# Patient Record
Sex: Female | Born: 1975 | Race: White | Hispanic: No | Marital: Married
Health system: Southern US, Community
[De-identification: ages and names within clinical notes are randomized; demographics above are authoritative.]

---

## 2002-10-04 ENCOUNTER — Emergency Department (HOSPITAL_COMMUNITY): Admission: EM | Admit: 2002-10-04 | Discharge: 2002-10-04 | Payer: Self-pay | Admitting: Emergency Medicine

## 2002-10-04 ENCOUNTER — Encounter: Payer: Self-pay | Admitting: Emergency Medicine

## 2004-08-21 ENCOUNTER — Ambulatory Visit: Payer: Self-pay

## 2004-08-22 ENCOUNTER — Ambulatory Visit: Payer: Self-pay

## 2004-08-28 ENCOUNTER — Ambulatory Visit: Payer: Self-pay

## 2005-01-06 ENCOUNTER — Emergency Department: Payer: Self-pay | Admitting: Obstetrics and Gynecology

## 2005-01-07 ENCOUNTER — Ambulatory Visit: Payer: Self-pay | Admitting: Obstetrics and Gynecology

## 2005-04-07 ENCOUNTER — Ambulatory Visit: Payer: Self-pay | Admitting: Cardiothoracic Surgery

## 2005-04-09 ENCOUNTER — Ambulatory Visit: Payer: Self-pay | Admitting: Gastroenterology

## 2005-05-12 ENCOUNTER — Other Ambulatory Visit: Payer: Self-pay

## 2005-05-18 ENCOUNTER — Inpatient Hospital Stay: Payer: Self-pay | Admitting: Surgery

## 2005-07-21 ENCOUNTER — Ambulatory Visit: Payer: Self-pay | Admitting: Gastroenterology

## 2005-09-25 ENCOUNTER — Observation Stay: Payer: Self-pay | Admitting: Gastroenterology

## 2006-12-14 ENCOUNTER — Emergency Department: Payer: Self-pay | Admitting: Unknown Physician Specialty

## 2007-02-11 ENCOUNTER — Ambulatory Visit: Payer: Self-pay | Admitting: Gastroenterology

## 2007-04-07 ENCOUNTER — Ambulatory Visit: Payer: Self-pay | Admitting: Gastroenterology

## 2007-10-04 ENCOUNTER — Ambulatory Visit: Payer: Self-pay | Admitting: Unknown Physician Specialty

## 2007-10-20 ENCOUNTER — Ambulatory Visit: Payer: Self-pay | Admitting: Unknown Physician Specialty

## 2008-05-28 ENCOUNTER — Ambulatory Visit: Payer: Self-pay | Admitting: Specialist

## 2008-05-28 ENCOUNTER — Ambulatory Visit: Payer: Self-pay | Admitting: Unknown Physician Specialty

## 2008-08-28 ENCOUNTER — Emergency Department: Payer: Self-pay | Admitting: Emergency Medicine

## 2009-08-15 ENCOUNTER — Observation Stay: Payer: Self-pay

## 2009-08-22 ENCOUNTER — Encounter: Payer: Self-pay | Admitting: Maternal and Fetal Medicine

## 2009-08-26 ENCOUNTER — Observation Stay: Payer: Self-pay | Admitting: Unknown Physician Specialty

## 2009-08-29 ENCOUNTER — Encounter: Payer: Self-pay | Admitting: Obstetrics & Gynecology

## 2009-12-23 ENCOUNTER — Other Ambulatory Visit: Payer: Self-pay | Admitting: Gastroenterology

## 2010-01-09 ENCOUNTER — Ambulatory Visit: Payer: Self-pay | Admitting: Gastroenterology

## 2010-01-13 ENCOUNTER — Other Ambulatory Visit: Payer: Self-pay | Admitting: Gastroenterology

## 2010-01-14 LAB — PATHOLOGY REPORT

## 2010-07-25 ENCOUNTER — Ambulatory Visit: Payer: Self-pay | Admitting: Gastroenterology

## 2010-07-26 ENCOUNTER — Ambulatory Visit: Payer: Self-pay | Admitting: Gastroenterology

## 2010-07-31 ENCOUNTER — Ambulatory Visit: Payer: Self-pay | Admitting: Gastroenterology

## 2010-08-08 ENCOUNTER — Ambulatory Visit: Payer: Self-pay | Admitting: Gastroenterology

## 2012-05-09 ENCOUNTER — Emergency Department: Payer: Self-pay | Admitting: Emergency Medicine

## 2012-05-09 LAB — COMPREHENSIVE METABOLIC PANEL
Albumin: 4 g/dL (ref 3.4–5.0)
Alkaline Phosphatase: 75 U/L (ref 50–136)
Anion Gap: 10 (ref 7–16)
BUN: 6 mg/dL — ABNORMAL LOW (ref 7–18)
Bilirubin,Total: 0.7 mg/dL (ref 0.2–1.0)
Calcium, Total: 9.4 mg/dL (ref 8.5–10.1)
Chloride: 104 mmol/L (ref 98–107)
Co2: 27 mmol/L (ref 21–32)
Creatinine: 0.72 mg/dL (ref 0.60–1.30)
EGFR (African American): >60
EGFR (Non-African Amer.): >60
Glucose: 116 mg/dL — ABNORMAL HIGH (ref 65–99)
Osmolality: 280 (ref 275–301)
Potassium: 4.3 mmol/L (ref 3.5–5.1)
SGOT(AST): 12 U/L — ABNORMAL LOW (ref 15–37)
SGPT (ALT): 16 U/L (ref 12–78)
Sodium: 141 mmol/L (ref 136–145)
Total Protein: 8.4 g/dL — ABNORMAL HIGH (ref 6.4–8.2)

## 2012-05-09 LAB — CBC
HCT: 46.3 % (ref 35.0–47.0)
HGB: 15.9 g/dL (ref 12.0–16.0)
MCH: 30.1 pg (ref 26.0–34.0)
MCHC: 34.4 g/dL (ref 32.0–36.0)
MCV: 88 fL (ref 80–100)
Platelet: 277 10*3/uL (ref 150–440)
RBC: 5.29 10*6/uL — ABNORMAL HIGH (ref 3.80–5.20)
RDW: 13.4 % (ref 11.5–14.5)
WBC: 15.2 10*3/uL — ABNORMAL HIGH (ref 3.6–11.0)

## 2012-05-09 LAB — URINALYSIS, COMPLETE
Bacteria: NONE SEEN
Bilirubin,UR: NEGATIVE
Glucose,UR: NEGATIVE mg/dL (ref 0–75)
Leukocyte Esterase: NEGATIVE
Nitrite: NEGATIVE
Ph: 5 (ref 4.5–8.0)
Protein: 30
RBC,UR: 15 /HPF (ref 0–5)
Specific Gravity: 1.02 (ref 1.003–1.030)
Squamous Epithelial: 2
WBC UR: 1 /HPF (ref 0–5)

## 2012-05-09 LAB — LIPASE, BLOOD: Lipase: 105 U/L (ref 73–393)

## 2012-05-13 ENCOUNTER — Ambulatory Visit: Payer: Self-pay | Admitting: Gastroenterology

## 2012-11-22 ENCOUNTER — Inpatient Hospital Stay: Payer: Self-pay | Admitting: Internal Medicine

## 2012-11-22 LAB — CBC WITH DIFFERENTIAL/PLATELET
Basophil #: 0.1 10*3/uL (ref 0.0–0.1)
Basophil %: 1.1 %
Eosinophil #: 0 10*3/uL (ref 0.0–0.7)
Eosinophil %: 0.4 %
HCT: 45.1 % (ref 35.0–47.0)
HGB: 15.1 g/dL (ref 12.0–16.0)
Lymphocyte #: 2.5 10*3/uL (ref 1.0–3.6)
Lymphocyte %: 21.8 %
MCH: 28.9 pg (ref 26.0–34.0)
MCHC: 33.5 g/dL (ref 32.0–36.0)
MCV: 86 fL (ref 80–100)
Monocyte #: 0.7 x10 3/mm (ref 0.2–0.9)
Monocyte %: 6 %
Neutrophil #: 8.1 10*3/uL — ABNORMAL HIGH (ref 1.4–6.5)
Neutrophil %: 70.7 %
Platelet: 234 10*3/uL (ref 150–440)
RBC: 5.24 10*6/uL — ABNORMAL HIGH (ref 3.80–5.20)
RDW: 13.5 % (ref 11.5–14.5)
WBC: 11.4 10*3/uL — ABNORMAL HIGH (ref 3.6–11.0)

## 2012-11-22 LAB — URINALYSIS, COMPLETE
Bacteria: NONE SEEN
Bilirubin,UR: NEGATIVE
Glucose,UR: NEGATIVE mg/dL (ref 0–75)
Leukocyte Esterase: NEGATIVE
Nitrite: NEGATIVE
Ph: 5 (ref 4.5–8.0)
RBC,UR: 3 /HPF (ref 0–5)
WBC UR: 2 /HPF (ref 0–5)

## 2012-11-22 LAB — COMPREHENSIVE METABOLIC PANEL
Albumin: 3.7 g/dL (ref 3.4–5.0)
Alkaline Phosphatase: 68 U/L (ref 50–136)
Anion Gap: 8 (ref 7–16)
BUN: 8 mg/dL (ref 7–18)
Bilirubin,Total: 1 mg/dL (ref 0.2–1.0)
Calcium, Total: 8.7 mg/dL (ref 8.5–10.1)
Chloride: 104 mmol/L (ref 98–107)
Co2: 26 mmol/L (ref 21–32)
Creatinine: 0.61 mg/dL (ref 0.60–1.30)
EGFR (African American): >60
EGFR (Non-African Amer.): >60
Glucose: 84 mg/dL (ref 65–99)
Osmolality: 273 (ref 275–301)
Potassium: 3.3 mmol/L — ABNORMAL LOW (ref 3.5–5.1)
SGOT(AST): 15 U/L (ref 15–37)
SGPT (ALT): 17 U/L (ref 12–78)
Sodium: 138 mmol/L (ref 136–145)
Total Protein: 7.4 g/dL (ref 6.4–8.2)

## 2012-11-22 LAB — LIPASE, BLOOD: Lipase: 57 U/L — ABNORMAL LOW (ref 73–393)

## 2012-11-22 LAB — PROTIME-INR
INR: 1.1
Prothrombin Time: 14 secs (ref 11.5–14.7)

## 2012-11-22 LAB — AMYLASE: Amylase: 43 U/L (ref 25–115)

## 2012-11-22 LAB — APTT: Activated PTT: 33.5 secs (ref 23.6–35.9)

## 2012-11-24 LAB — CBC WITH DIFFERENTIAL/PLATELET
Basophil %: 0.2 %
HCT: 38.5 % (ref 35.0–47.0)
Lymphocyte #: 0.6 10*3/uL — ABNORMAL LOW (ref 1.0–3.6)
Lymphocyte %: 9.5 %
MCH: 29.6 pg (ref 26.0–34.0)
MCHC: 34.2 g/dL (ref 32.0–36.0)
Monocyte #: 0.1 x10 3/mm — ABNORMAL LOW (ref 0.2–0.9)
Neutrophil %: 89.1 %
Platelet: 211 10*3/uL (ref 150–440)
RBC: 4.45 10*6/uL (ref 3.80–5.20)

## 2012-11-24 LAB — COMPREHENSIVE METABOLIC PANEL
Albumin: 2.9 g/dL — ABNORMAL LOW (ref 3.4–5.0)
Alkaline Phosphatase: 51 U/L (ref 50–136)
Anion Gap: 5 — ABNORMAL LOW (ref 7–16)
BUN: 4 mg/dL — ABNORMAL LOW (ref 7–18)
Bilirubin,Total: 0.4 mg/dL (ref 0.2–1.0)
Co2: 25 mmol/L (ref 21–32)
EGFR (African American): >60
Glucose: 146 mg/dL — ABNORMAL HIGH (ref 65–99)
Potassium: 4.1 mmol/L (ref 3.5–5.1)
SGOT(AST): 11 U/L — ABNORMAL LOW (ref 15–37)
SGPT (ALT): 13 U/L (ref 12–78)

## 2012-11-26 LAB — CBC WITH DIFFERENTIAL/PLATELET
Basophil #: 0 10*3/uL (ref 0.0–0.1)
Basophil %: 0.2 %
HCT: 37.6 % (ref 35.0–47.0)
HGB: 12.8 g/dL (ref 12.0–16.0)
Lymphocyte #: 2.4 10*3/uL (ref 1.0–3.6)
MCHC: 34 g/dL (ref 32.0–36.0)
Neutrophil #: 11.3 10*3/uL — ABNORMAL HIGH (ref 1.4–6.5)
Platelet: 222 10*3/uL (ref 150–440)
RBC: 4.34 10*6/uL (ref 3.80–5.20)
RDW: 13.7 % (ref 11.5–14.5)
WBC: 14.7 10*3/uL — ABNORMAL HIGH (ref 3.6–11.0)

## 2012-11-26 LAB — BASIC METABOLIC PANEL
Anion Gap: 5 — ABNORMAL LOW (ref 7–16)
Co2: 30 mmol/L (ref 21–32)
EGFR (African American): >60
Potassium: 3.2 mmol/L — ABNORMAL LOW (ref 3.5–5.1)
Sodium: 144 mmol/L (ref 136–145)

## 2012-11-27 LAB — CULTURE, BLOOD (SINGLE)

## 2013-12-12 ENCOUNTER — Ambulatory Visit: Payer: Self-pay | Admitting: Gastroenterology

## 2014-01-23 ENCOUNTER — Ambulatory Visit: Payer: Self-pay | Admitting: Gastroenterology

## 2014-10-26 NOTE — Consult Note (Signed)
Pt seen and examined. Please see Toni Richards's notes. When seen earlier by Toni Richards, patient was feeling well, but now has increasing abd pain with vomiting. Feels like yest again. Too early to advance diet. Will change to clears. If not tolerated, then NPO except meds/ice chips. Ordered another dose of cimzia. Was supposed to get it yest. Will start iv steroids tonight. Will follow. Thanks.  Electronic Signatures: Toni Richards, Toni Richards (MD)  (Signed on 21-May-14 16:27)  Authored  Last Updated: 21-May-14 16:27 by Toni Richards, Toni Richards (MD)

## 2014-10-26 NOTE — Consult Note (Signed)
Chief Complaint:  Subjective/Chief Complaint Feeling much better. No abd pain. No nausea today. Having BM's   VITAL SIGNS/ANCILLARY NOTES: **Vital Signs.:   22-May-14 04:18  Vital Signs Type Routine  Temperature Temperature (F) 97.3  Temperature Source oral  Pulse Pulse 65  Respirations Respirations 18  Systolic BP Systolic BP 295  Diastolic BP (mmHg) Diastolic BP (mmHg) 65  Mean BP 77  Pulse Ox % Pulse Ox % 98  Pulse Ox Activity Level  At rest  Oxygen Delivery Room Air/ 21 %   Brief Assessment:  Cardiac Regular   Respiratory clear BS   Gastrointestinal Normal   Lab Results: Hepatic:  22-May-14 04:37   Bilirubin, Total 0.4  Alkaline Phosphatase 51  SGPT (ALT) 13  SGOT (AST)  11  Total Protein, Serum  6.1  Albumin, Serum  2.9  Routine Chem:  22-May-14 04:37   Glucose, Serum  146  BUN  4  Creatinine (comp) 0.65  Sodium, Serum 137  Potassium, Serum 4.1  Chloride, Serum 107  CO2, Serum 25  Calcium (Total), Serum  8.2  Osmolality (calc) 273  eGFR (African American) >60  eGFR (Non-African American) >60 (eGFR values <46m/min/1.73 m2 may be an indication of chronic kidney disease (CKD). Calculated eGFR is useful in patients with stable renal function. The eGFR calculation will not be reliable in acutely ill patients when serum creatinine is changing rapidly. It is not useful in  patients on dialysis. The eGFR calculation may not be applicable to patients at the low and high extremes of body sizes, pregnant women, and vegetarians.)  Anion Gap  5  Routine Hem:  22-May-14 04:37   WBC (CBC) 6.6  RBC (CBC) 4.45  Hemoglobin (CBC) 13.2  Hematocrit (CBC) 38.5  Platelet Count (CBC) 211  MCV 87  MCH 29.6  MCHC 34.2  RDW 13.4  Neutrophil % 89.1  Lymphocyte % 9.5  Monocyte % 1.2  Eosinophil % 0.0  Basophil % 0.2  Neutrophil # 5.9  Lymphocyte #  0.6  Monocyte #  0.1  Eosinophil # 0.0  Basophil # 0.0 (Result(s) reported on 24 Nov 2012 at 05:35AM.)    Assessment/Plan:  Assessment/Plan:  Assessment Crohn's ileitis. Much better on IV steroids.   Plan Full liquid diet for lunch. If tolerated, can even try low residue diet tonight. If tolerates solids, ok for discharge on po prednisone soon. thanks.   Electronic Signatures: OVerdie Shire(MD)  (Signed 22-May-14 11:19)  Authored: Chief Complaint, VITAL SIGNS/ANCILLARY NOTES, Brief Assessment, Lab Results, Assessment/Plan   Last Updated: 22-May-14 11:19 by OVerdie Shire(MD)

## 2014-10-26 NOTE — H&P (Signed)
PATIENT NAME:  Toni Richards, Toni Richards MR#:  [id number] DATE OF BIRTH:  [birth year]  DATE OF ADMISSION:  11/22/2012  HISTORY OF PRESENT ILLNESS: The patient presents acutely with acute colitis, dehydration and ileus. She has known history of Crohn's disease and was seen in the office approximately 10 days ago, given Levaquin and Flagyl with p.o. Nexium, and her symptoms did improve. She ran out of the antibiotics approximately 4 days ago and started having recurrent pain. Apparently over the weekend she had worsening pain to the point for the last 48 hours she has not been able to hold down any liquids nor food. She was having bowel movements, but those stopped about 24 hours ago. She has progressive pain, fever, lethargy, really hardly able to sit up. She is post appendectomy and cholecystectomy. In light of her severe abdominal pain, fever and ileus, she will be admitted for further evaluation and treatment.   PAST MEDICAL HISTORY: Crohn's disease.   PAST SURGICAL HISTORY: 1.  Laparoscopy.  2.  C-section.  3.  Partial colectomy. 4.  Small bowel resection in 2006.  5.  Cholecystectomy.  6.  Appendectomy.   ALLERGIES: PENICILLIN.   SOCIAL HISTORY: Married, 3 kids. No smoking, no alcohol.   FAMILY HISTORY: Noncontributory.   REVIEW OF SYSTEMS: Otherwise negative.   PHYSICAL EXAMINATION: VITAL SIGNS: Blood pressure 94/62, temperature 99.7, pulse 92, weight 104.  HEENT: Benign.  LUNGS: Clear.  HEART: Regular rhythm. No audible murmur.  ABDOMEN: No bowel sounds heard. Soft diffuse severe tenderness, right lower quadrant more than left lower quadrant. No rebound.  SKIN: No rash.  EXTREMITIES: No edema. Good peripheral pulses.  NEUROLOGICAL:  Intact. No deficits.   LABORATORY DATA: Pending.   ASSESSMENT: Acute colitis/ileus-moderately worsened.    PLAN:  Abdominal and pelvic CT scan.  IV Flagyl/Levaquin with IV hydration. GI consult. May need steroids. No clear evidence for surgical need. No  peritoneal signs.  ____________________________ Danella PentonMark F. Mamadou Breon, MD [email or web address]:cb D: 11/22/2012 17:26:11 ET T: 11/22/2012 17:35:44 ET JOB#: [id number]  cc: Danella PentonMark F. Yazhini Mcaulay, MD, <Dictator> Danella PentonMARK F Durenda Pechacek MD ELECTRONICALLY SIGNED 11/23/2012 8:17

## 2014-10-26 NOTE — Consult Note (Signed)
Brief Consult Note: Diagnosis: Crohns, abdominal pain, Abnormal CT scan.   Patient was seen by consultant.   Consult note dictated.   Discussed with Attending MD.   Comments: Patient seen and examined. Abdominal pain improved from yesterday, but still mildly present. Has not passed BM or Flatus over the past 24 hours. Currently tolerating a full liquid diet with minimal nausea, does not make abdo pain worse. CT reviewed, suggestive of RLQ small bowel wall thickening, with multiple dilated loops of small bowel suggestive of an ileus. Hx of Crohns, has been well-maintained on Cimzia through Dr. Skeet SimmerIftikar. Last colonoscopy was 3 weeks ago by Dr Skeet SimmerIftikar and per patient, was a normal exam. Patient was due for Cimzia injection yesterday, but missed dose bc she was in the hospital.We do reccommend administering this dose to her today; I will check with the pharmacy here and if not available, patient can get it from outside.. Continue abx as pt is symptomatically improving and this may have an infectious contributing factor. blood cx pending. Will continue to follow closely and see how she responds to above. Full consult being dictated.  Electronic Signatures: Ashok Cordiaarle, Orli Degrave M (PA-C)  (Signed 21-May-14 13:43)  Authored: Brief Consult Note   Last Updated: 21-May-14 13:43 by Ashok CordiaEarle, Nana Hoselton M (PA-C)

## 2014-10-26 NOTE — Consult Note (Signed)
PATIENT NAME:  Toni Richards, Lashunta MR#:  [id number] DATE OF BIRTH:  January 05, 1976  DATE OF CONSULTATION:  11/23/2012  REFERRING PHYSICIAN:  Bethann PunchesMark Miller, MD CONSULTING PHYSICIAN:  Hardie ShackletonKaryn M. Colin BentonEarle, PA-C  ATTENDING GASTROENTEROLOGIST:  Lutricia FeilPaul Oh, MD  REASON FOR CONSULTATION: Abdominal pain and possible Crohn's flare.   HISTORY OF PRESENT ILLNESS: This is a pleasant 39 year old female who presents with a few weeks of abdominal pain that has gotten worse over the past 24 to 48 hours. She states that it got severe last night and decided to present to the Emergency Room. She does have a past medical history significant for Crohn's disease and has been established with Dr. Niel HummerIftikhar over the past 3 years. She has been well controlled for several years on Cimzia 400 mg q. 4 weeks. She does state that she recently underwent a colonoscopy by Dr. Niel HummerIftikhar approximately 3 weeks ago and findings were unremarkable. It was a normal colon exam. She does have a history of prior bowel surgery due to Crohn's disease and currently has a ileocolonic anastomosis. This anastomotic site did appear normal on recent colonoscopy. Upon further workup, her white blood cells were within normal limits, as was her lipase, amylase and LFT levels. Urinalysis has been unremarkable as well. Vital signs are stable and she denies any history of fevers. Of note, she has not had a bowel movement or passed flatus for the past 24 hours. She was n.p.o. yesterday and was started on a full liquid diet today. She states that she has attempted some liquids earlier this morning and this did not make the pain worse. Since being admitted, she was admitted with IV antibiotics, including Levaquin and Flagyl. She does state that she feels a lot better today when compared to yesterday, but she is not 100%. There is some mild nausea but this is tolerable and it is not affecting her appetite. The abdominal pain is noted periumbilically and slightly off to the left side. No  unintentional weight changes. No other symptomatic concerns or complaints.   ALLERGIES: PENICILLIN.   PAST MEDICAL HISTORY: Uterine fibroids and Crohn's disease.   PAST SURGICAL HISTORY: Partial colectomy and subsequent ileocolonic anastomosis, C-section x 2, laparoscopy, appendectomy and cholecystectomy.   SOCIAL HISTORY: The patient denies any alcohol, tobacco or illicit drug use.   FAMILY HISTORY: Maternal aunt, maternal cousin, maternal grandfather and the patient's mother all have had Crohn's disease. Her sister has ulcerative colitis. No known colon polyps or GI malignancy in the family.   REVIEW OF SYSTEMS: A 10-system review of systems was obtained on the patient. All pertinent positives are mentioned above and otherwise negative.   OBJECTIVE: VITAL SIGNS: Blood pressure 92/57, heart rate 68, respirations 18, temperature 97.9, bedside pulse ox is 98%.  GENERAL: This is a pleasant 39 year old female resting quietly and comfortably in the exam room, in no acute distress. Alert and oriented x 3.  HEAD: Atraumatic, normocephalic.  NECK: Supple. No lymphadenopathy noted.  HEENT: Sclerae anicteric. Mucous membranes moist.  PULMONARY:  Respirations are even and unlabored, clear to auscultation bilateral anterior lung fields.  CARDIAC: Regular rate and rhythm. S1, S2 noted.  ABDOMEN: Soft, mild tenderness to palpation is noted periumbilically and slightly off to the left side. There is no guarding or rebound. No signs of an acute abdomen. Normoactive bowel sounds noted in all 4 quadrants. No masses, hernias or organomegaly appreciated. Multiple abdominal scars from prior abdominal surgeries are noted.  RECTAL: Exam deferred.  PSYCHIATRIC: Appropriate mood and affect.  LABORATORY DATA: White blood cells 11.4, hemoglobin 15.1, hematocrit 45.1, platelets 234. Sodium 138, potassium 3.3, BUN 8, creatinine 0.61, glucose 84. LFTs are within normal limits. Lipase 57. INR 1.1, PT 14.0. Urinalysis  is within normal limits.   IMAGING: CT of the abdomen and pelvis was obtained on the patient and findings were consistent with multiple loops of dilated small bowel at the approximate level of the bowel suture line within the ascending colon. This is suggestive of an ileus. There was also bowel wall thickening of the small bowel loops in the right lower quadrant suggestive of infectious versus inflammatory versus ischemic etiology.   ASSESSMENT: 1.  Abnormal CT scan suggestive of an ileus with multiple dilated loops of small bowel as well small bowel wall thickening.  2.  Abdominal pain, periumbilically and slightly off to the left.  3.  History of Crohn's disease, previously well controlled on Cimzia injections subcutaneously q. 4 weeks.   PLAN: I have discussed this patient's case in detail with Dr. Lutricia Feil, who is involved in the development of the patient's plan of care. We did review the patient's CT scan and it is possible that this is an ileus secondary to inflammation from Crohn's disease versus an infectious etiology. She has noticed significant symptomatic improvement with Levaquin and Flagyl and, therefore, we do recommend that she be maintained on these antibiotics. Of note, she is controlled at home with Cimzia for Crohn's disease and she was due for a dose yesterday but missed it because of coming to the hospital. Therefore, we will try to coordinate so we can get her a dose of 400 mg Cimzia subcutaneously while inpatient today. If not, the patient did state that she can get her home dose and bring it into the hospital. Of note, the patient still continues to not past stool or flatus, but she is currently tolerating a full liquid diet. Because she is continuing to symptomatically improve and she did recently have a normal colonoscopy 3 weeks ago by Dr. Niel Hummer, we do recommend continuing with the above recommendations, symptomatic management with pain control and antiemetics as needed and  administering a Cimzia dose today for Crohn's disease. We will continue to monitor this patient throughout hospitalization and make further recommendations pending above and per clinical course.   Thank you so much for this consultation and for allowing [place] to participate in the patient's plan of care.   ____________________________ Hardie Shackleton. Kelissa Merlin, PA-C kme:cs D: 11/23/2012 14:04:11 ET T: 11/23/2012 14:16:12 ET JOB#: [id number]  cc: Hardie Shackleton. Kenley Troop, PA-C, <Dictator> Hardie Shackleton Timara Loma PA ELECTRONICALLY SIGNED 11/23/2012 16:00

## 2014-10-26 NOTE — Discharge Summary (Signed)
PATIENT NAME:  Toni Richards, Toni Richards MR#:  [id number] DATE OF BIRTH:  03-Aug-1975  DATE OF ADMISSION:  11/22/2012 DATE OF DISCHARGE:  11/26/2012  DISCHARGE DIAGNOSIS: Crohn's disease with flare.   DISCHARGE MEDICATIONS: Prednisone 20 mg 2 daily, metronidazole 500 t.i.d., levofloxacin 500 daily, Dulcolax suppository 10 mg q.12 hours p.r.n. constipation.   HISTORY AND PHYSICAL: Please see detailed history and physical done on admission.   HOSPITAL COURSE: The patient was admitted, put on IV steroids, IV antibiotics for a presumed Crohn's flare. She was seen in consultation by Dr. Bluford Kaufmannh from gastroenterology, who followed her closely and helped coordinate her therapy. The flare was in the ileum but again improved markedly. She did well with a day of prednisone, and as well from yesterday until today tolerated the oral  therapy without any complications. She will be discharged home in good condition with early followup with Dr. Hyacinth MeekerMiller.   ____________________________ Marya AmslerMarshall W. Dareen PianoAnderson, MD mwa:jm D: 11/26/2012 10:51:09 ET T: 11/26/2012 21:02:46 ET JOB#: [id number]  cc: Marya AmslerMarshall W. Dareen PianoAnderson, MD, <Dictator> Lauro RegulusMARSHALL W ANDERSON MD ELECTRONICALLY SIGNED 11/27/2012 9:57

## 2020-11-13 ENCOUNTER — Other Ambulatory Visit: Payer: Self-pay | Admitting: Family Medicine

## 2020-11-13 ENCOUNTER — Other Ambulatory Visit: Payer: Self-pay

## 2020-11-13 ENCOUNTER — Ambulatory Visit
Admission: RE | Admit: 2020-11-13 | Discharge: 2020-11-13 | Disposition: A | Discharge status: Home / Self Care | Payer: BC Managed Care – PPO | Source: Ambulatory Visit | Attending: Family Medicine | Admitting: Family Medicine

## 2020-11-13 DIAGNOSIS — N939 Abnormal uterine and vaginal bleeding, unspecified: Secondary | ICD-10-CM | POA: Diagnosis present

## 2020-11-13 DIAGNOSIS — R1032 Left lower quadrant pain: Secondary | ICD-10-CM | POA: Diagnosis present

## 2020-11-13 DIAGNOSIS — R1031 Right lower quadrant pain: Secondary | ICD-10-CM | POA: Diagnosis present

## 2021-12-10 IMAGING — US US PELVIS COMPLETE WITH TRANSVAGINAL
1 series · 14 of 25 positions shown · non-contrast
Comparison: None

CLINICAL DATA: Vaginal bleeding, BILATERAL lower abdominal pain;
LMP 11/08/2020. History of Caesarean section x2

EXAM:
TRANSABDOMINAL AND TRANSVAGINAL ULTRASOUND OF PELVIS
TECHNIQUE: Both transabdominal and transvaginal ultrasound examinations of the
pelvis were performed. Transabdominal technique was performed for
global imaging of the pelvis including uterus, ovaries, adnexal
regions, and pelvic cul-de-sac. It was necessary to proceed with
endovaginal exam following the transabdominal exam to visualize the
lower uterine segment/cervix and adnexa.

[Series 1: us pelvic complete with transvaginal · 14 of 94 slices shown]
[im 1/94]
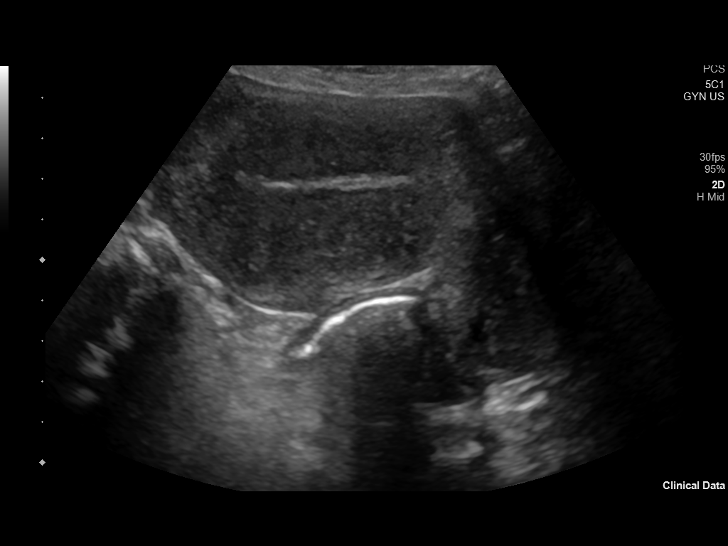
[im 8/94]
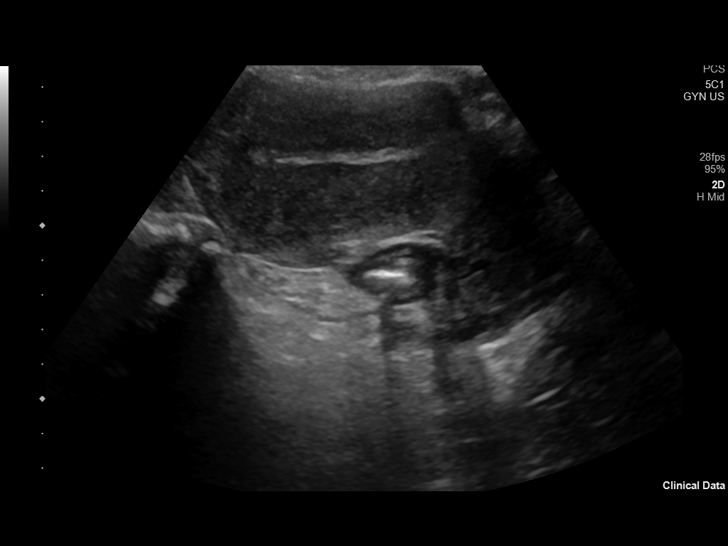
[im 16/94]
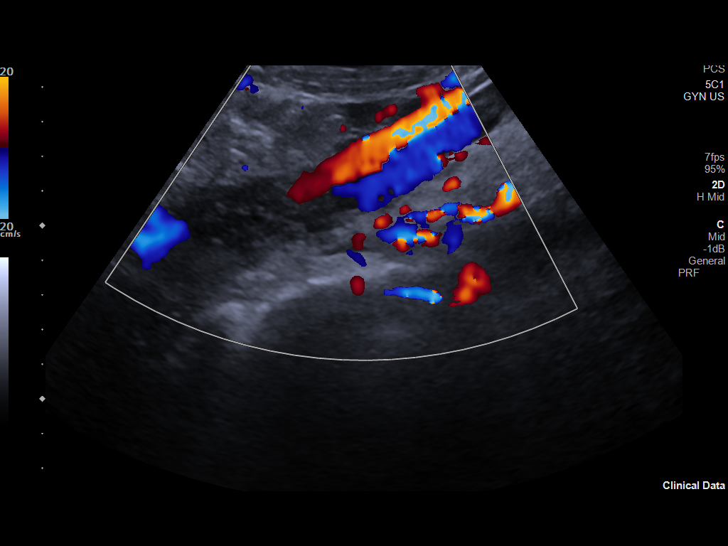
[im 24/94]
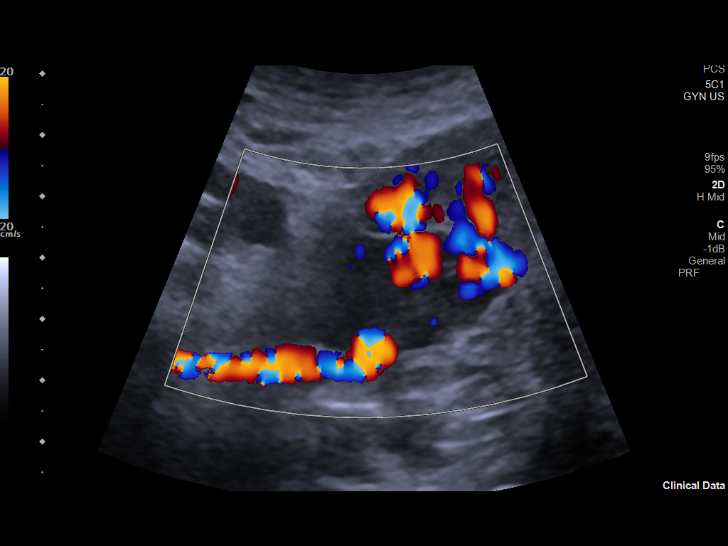
[im 32/94]
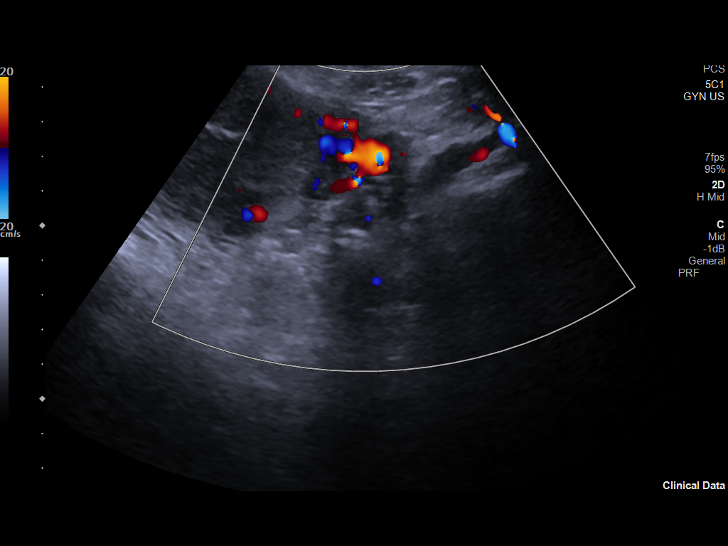
[im 35/94]
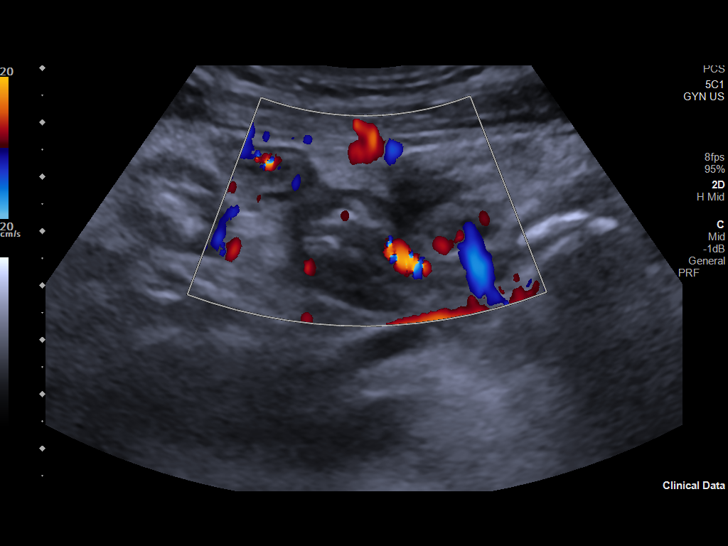
[im 43/94]
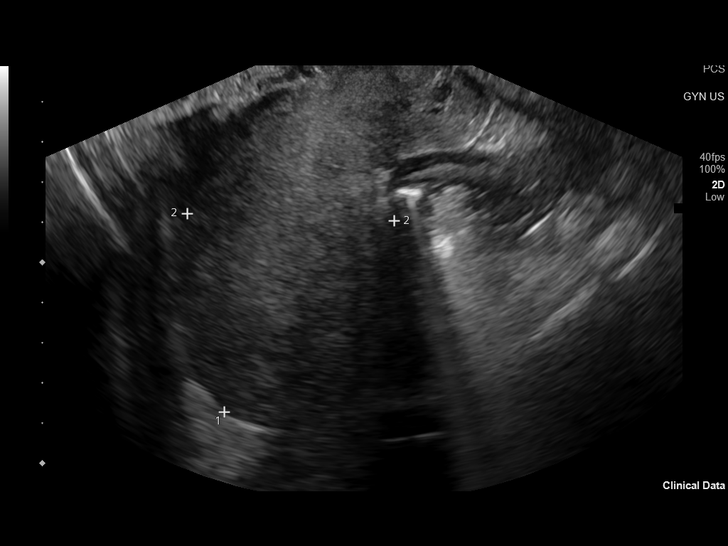
[im 51/94]
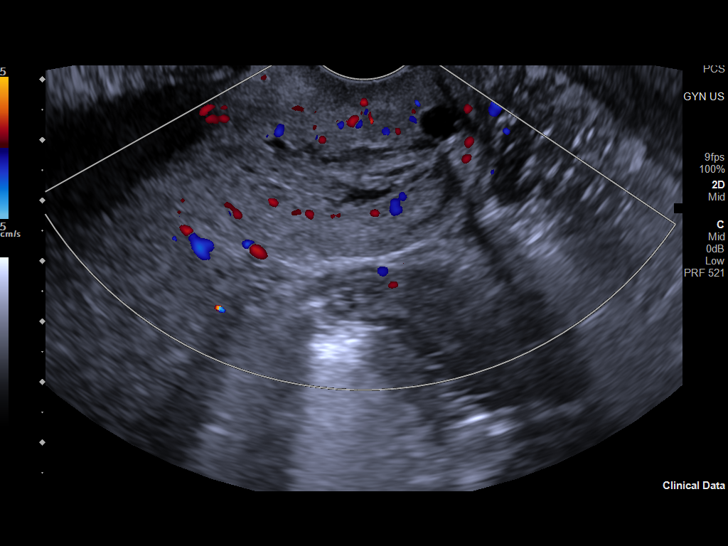
[im 59/94]
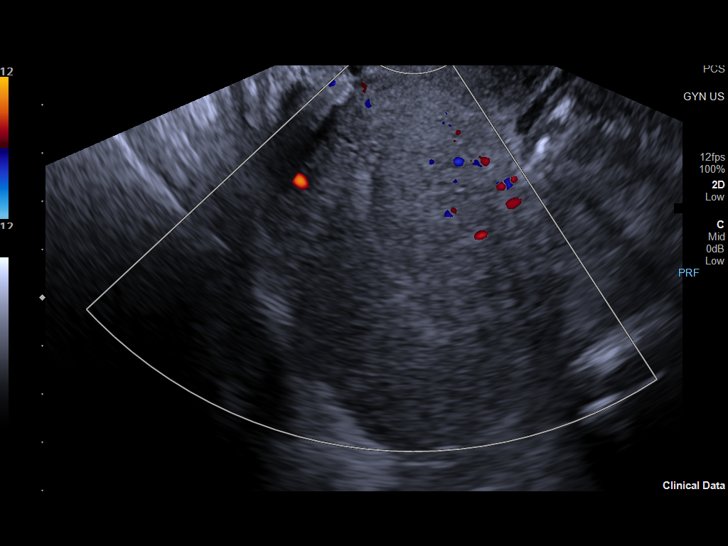
[im 63/94]
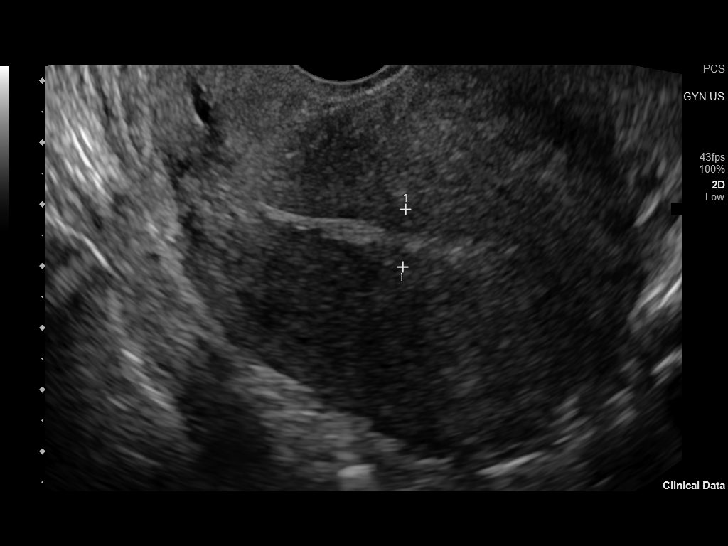
[im 70/94]
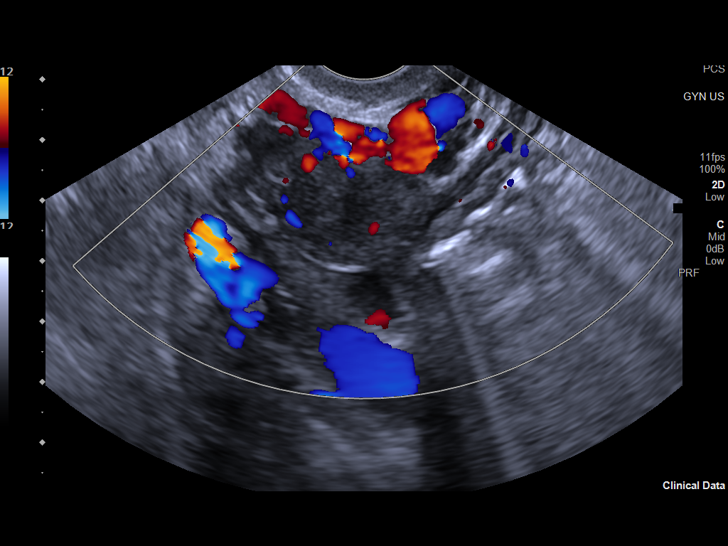
[im 78/94]
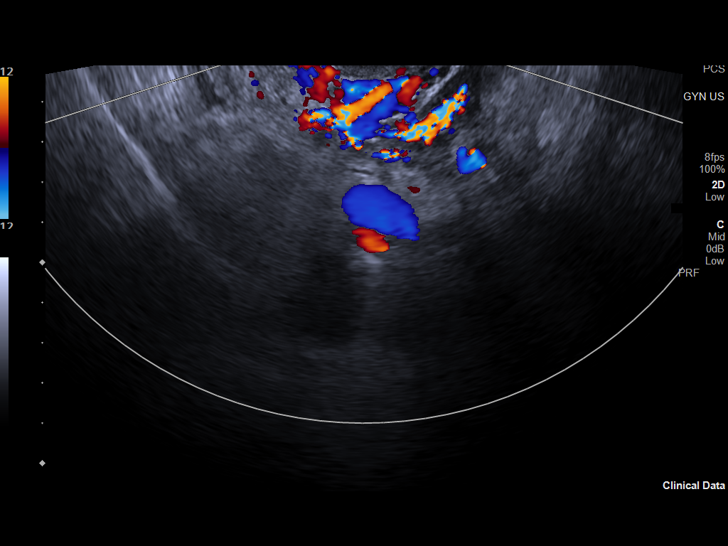
[im 86/94]
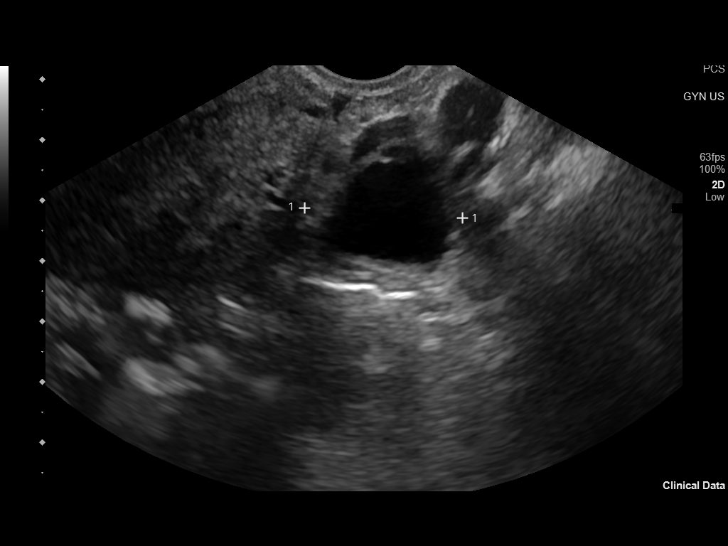
[im 94/94]
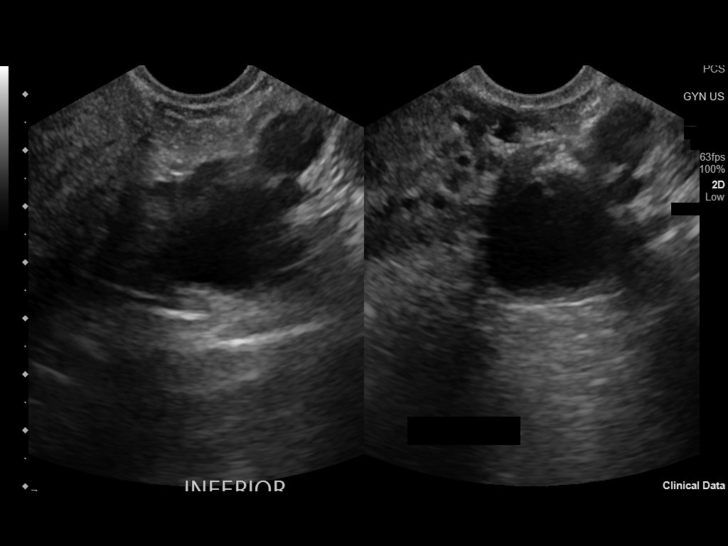

[14 of 25 positions shown; findings below may reference images not displayed]

FINDINGS: Uterus

Measurements: 11.4 x 5.5 x 7.4 cm = volume: 239 mL. Retroflexed.
Homogeneous myometrium. No focal mass.

Endometrium

Thickness: 9 mm.  No endometrial fluid or focal abnormality

Right ovary

Measurements: 2.5 x 1.9 x 2.9 cm = volume: 7.2 mL. Normal morphology
without mass

Left ovary

Measurements: 2.9 x 2.2 x 2.6 cm = volume: 8.5 mL. Normal morphology
without mass

Other findings

Trace free pelvic fluid.  No adnexal masses.
IMPRESSION: Normal exam.
